# Patient Record
Sex: Female | Born: 1955 | Race: Black or African American | Hispanic: No | Smoking: Current every day smoker
Health system: Southern US, Community
[De-identification: ages and names within clinical notes are randomized; demographics above are authoritative.]

## PROBLEM LIST (undated history)

## (undated) DIAGNOSIS — K769 Liver disease, unspecified: Secondary | ICD-10-CM

## (undated) DIAGNOSIS — K219 Gastro-esophageal reflux disease without esophagitis: Secondary | ICD-10-CM

## (undated) DIAGNOSIS — I10 Essential (primary) hypertension: Secondary | ICD-10-CM

## (undated) DIAGNOSIS — B1920 Unspecified viral hepatitis C without hepatic coma: Secondary | ICD-10-CM

## (undated) HISTORY — DX: Gastro-esophageal reflux disease without esophagitis: K21.9

## (undated) HISTORY — DX: Essential (primary) hypertension: I10

## (undated) HISTORY — DX: Unspecified viral hepatitis C without hepatic coma: B19.20

## (undated) HISTORY — DX: Liver disease, unspecified: K76.9

---

## 2007-03-28 ENCOUNTER — Ambulatory Visit: Payer: Self-pay | Admitting: Cardiology

## 2016-07-05 ENCOUNTER — Encounter (INDEPENDENT_AMBULATORY_CARE_PROVIDER_SITE_OTHER): Payer: Self-pay | Admitting: Internal Medicine

## 2016-07-05 ENCOUNTER — Encounter (INDEPENDENT_AMBULATORY_CARE_PROVIDER_SITE_OTHER): Payer: Self-pay

## 2016-07-25 ENCOUNTER — Ambulatory Visit (INDEPENDENT_AMBULATORY_CARE_PROVIDER_SITE_OTHER): Payer: Self-pay | Admitting: Internal Medicine

## 2016-08-16 ENCOUNTER — Ambulatory Visit (INDEPENDENT_AMBULATORY_CARE_PROVIDER_SITE_OTHER): Payer: Self-pay | Admitting: Internal Medicine

## 2020-08-19 ENCOUNTER — Encounter (INDEPENDENT_AMBULATORY_CARE_PROVIDER_SITE_OTHER): Payer: Self-pay | Admitting: *Deleted

## 2020-11-24 ENCOUNTER — Ambulatory Visit (INDEPENDENT_AMBULATORY_CARE_PROVIDER_SITE_OTHER): Payer: Medicare HMO | Admitting: Gastroenterology

## 2020-11-24 ENCOUNTER — Encounter (INDEPENDENT_AMBULATORY_CARE_PROVIDER_SITE_OTHER): Payer: Self-pay | Admitting: Gastroenterology

## 2020-11-24 ENCOUNTER — Other Ambulatory Visit: Payer: Self-pay

## 2020-11-24 DIAGNOSIS — B182 Chronic viral hepatitis C: Secondary | ICD-10-CM | POA: Diagnosis not present

## 2020-11-24 NOTE — Patient Instructions (Signed)
Perform blood workup Schedule liver elastography  

## 2020-11-24 NOTE — Progress Notes (Signed)
Brandi Costa, M.D. Gastroenterology & Hepatology [facility] [address] [place], [place] [address] Primary Care Physician: Jarvis Morgan, DO [address] [place] [address]  Referring MD: PCP  Chief Complaint: Hepatitis C  History of Present Illness: Brandi Costa is a 65 y.o. female with medical history of GERD, hypertension and hepatitis C, who presents for evaluation of hepatitis C.  Patient reports that in the 1993 she was told she tested for hepatitis C. She reports that she was a blood donor in the past but she was notified she had hepatitis C and could not donate more. She has never received treatment for hepatitis C. She reports that she received a blood transfusion when she underwent a hysterectomy. Denies tattoos. Denies any drugs in the past. She states that she had a STD in the past, does not know which one but was treated - does not know if any of her sexual partners had hepatitis C.  The patient was evaluated by her PCP in January 2022, had testing for hepatitis C check at that time.  Blood test reviewed from 08/12/2020 which showed positive hepatitis C antibody with RNA titer of 984,000.  HIV testing was negative.  CMP showed AST of 29, ALT of 27, alkaline phosphatase 97, total bilirubin 0.4, normal electrolytes, creatinine 1.07, BUN 24, albumin 4.2, CBC with low cell count 9.5, hemoglobin 13.8 and platelets 218.  Patient was referred to gastroenterology clinic for further evaluation.  She denies having any frequent complaints, but reports that after her most recent colonoscopy she has presented episodes of bloating. Has presented episodes of excessive tiredness in the past, as well as sleepiness but not recently. The patient denies having any nausea, vomiting, fever, chills, hematochezia, melena, hematemesis, abdominal distention, abdominal pain, diarrhea, jaundice, pruritus or weight loss.  Takes famotidine as  needed for heartburn.  Last EGD:2005 - possible diverticulum in esophagus? No report available Last Colonoscopy: 04/07/2020, report from [facility] describes multiple 3 to 5 mm polyps in the sigmoid, ascending and cecum, removed with hot snare.  No pathology is available but based on notes, there was presence of multiple tubular adenomas and patient was recommended to have a repeat colonoscopy in 3 years.  FHx: neg for any gastrointestinal/liver disease, no malignancies Social: smokes 1/4 pack a day, neg alcohol or illicit drug use Surgical: hysterectomy  Past Medical History: Past Medical History:  Diagnosis Date  . GERD (gastroesophageal reflux disease)   . Hepatitis C   . Hypertension   . Liver disease     Past Surgical History:History reviewed. No pertinent surgical history.  Family History: Family History  Problem Relation Age of Onset  . Coronary artery disease Father   . Heart disease Sister   . Coronary artery disease Brother   . Hypertension Brother     Social History: Social History   Tobacco Use  Smoking Status Current Every Day Smoker  . Packs/day: 0.25  . Types: Cigarettes  Smokeless Tobacco Never Used   Social History   Substance and Sexual Activity  Alcohol Use Never   Social History   Substance and Sexual Activity  Drug Use Never    Allergies: Allergies  Allergen Reactions  . Asa [Aspirin] Diarrhea  . Codeine Diarrhea    Medications: Current Outpatient Medications  Medication Sig Dispense Refill  . Ascorbic Acid (VITAMIN C WITH ROSE HIPS) 1000 MG tablet Take 1,000 mg by mouth daily.    . famotidine (PEPCID) 20  MG tablet Take 20 mg by mouth 2 (two) times daily.    Marland Kitchen loratadine (CLARITIN) 10 MG tablet Take 10 mg by mouth daily.    Marland Kitchen OVER THE COUNTER MEDICATION Vit D3 1000 IU once per day.    Marland Kitchen OVER THE COUNTER MEDICATION Vit E 450 mg once per day.    . triamterene-hydrochlorothiazide (DYAZIDE) 37.5-25 MG capsule Take 1 capsule by mouth  daily.    . Turmeric (QC TUMERIC COMPLEX PO) Take by mouth. 500 mg once per day.     No current facility-administered medications for this visit.    Review of Systems: GENERAL: negative for malaise, night sweats HEENT: No changes in hearing or vision, no nose bleeds or other nasal problems. NECK: Negative for lumps, goiter, pain and significant neck swelling RESPIRATORY: Negative for cough, wheezing CARDIOVASCULAR: Negative for chest pain, leg swelling, palpitations, orthopnea GI: SEE HPI MUSCULOSKELETAL: Negative for joint pain or swelling, back pain, and muscle pain. SKIN: Negative for lesions, rash PSYCH: Negative for sleep disturbance, mood disorder and recent psychosocial stressors. HEMATOLOGY Negative for prolonged bleeding, bruising easily, and swollen nodes. ENDOCRINE: Negative for cold or heat intolerance, polyuria, polydipsia and goiter. NEURO: negative for tremor, gait imbalance, syncope and seizures. The remainder of the review of systems is noncontributory.   Physical Exam: BP 133/80 (BP Location: Left Arm, Patient Position: Sitting, Cuff Size: Large)   Pulse (!) 101   Temp 98.7 F (37.1 C) (Oral)   Ht 5\' 4"  (1.626 m)   Wt 168 lb (76.2 kg)   BMI 28.84 kg/m  GENERAL: The patient is AO x3, in no acute distress. HEENT: Head is normocephalic and atraumatic. EOMI are intact. Mouth is well hydrated and without lesions. NECK: Supple. No masses LUNGS: Clear to auscultation. No presence of rhonchi/wheezing/rales. Adequate chest expansion HEART: RRR, normal s1 and s2. ABDOMEN: Soft, nontender, no guarding, no peritoneal signs, and nondistended. BS +. No masses. EXTREMITIES: Without any cyanosis, clubbing, rash, lesions or edema. NEUROLOGIC: AOx3, no focal motor deficit. SKIN: no jaundice, no rashes   Imaging/Labs: as above  I personally reviewed and interpreted the available labs, imaging and endoscopic files.  Impression and Plan: [name] is a 65 y.o. female  with medical history of GERD, hypertension and hepatitis C, who presents for evaluation of hepatitis C. the patient has history of chronic hepatitis C which has never been treated in the past.  It is likely she acquired this infection due to her previous blood transfusion.  She has not presented any To see manifestations of hepatitis C.  Her most recent blood testing did not show any alterations suggestive of liver fibrosis.  However, given the chronicity of her symptoms we will check a liver elastography and obtain MELD labs, I will also check a hepatitis C genotype, as well as hepatitis B core antibody and surface antigen before deciding the treatment regimen for her virus.  - check CBC, CMP, INR, hepatitis C genotype, hepatitis B core antibody and surface antigen -Schedule liver elastography  All questions were answered.      77, MD Gastroenterology and Hepatology [facility]

## 2020-11-28 LAB — COMPREHENSIVE METABOLIC PANEL
AG Ratio: 1 (calc) (ref 1.0–2.5)
ALT: 23 U/L (ref 6–29)
AST: 25 U/L (ref 10–35)
Albumin: 4.1 g/dL (ref 3.6–5.1)
Alkaline phosphatase (APISO): 99 U/L (ref 37–153)
BUN/Creatinine Ratio: 29 (calc) — ABNORMAL HIGH (ref 6–22)
BUN: 30 mg/dL — ABNORMAL HIGH (ref 7–25)
CO2: 25 mmol/L (ref 20–32)
Calcium: 9.4 mg/dL (ref 8.6–10.4)
Chloride: 106 mmol/L (ref 98–110)
Creat: 1.03 mg/dL — ABNORMAL HIGH (ref 0.50–0.99)
Globulin: 4.2 g/dL (calc) — ABNORMAL HIGH (ref 1.9–3.7)
Glucose, Bld: 81 mg/dL (ref 65–99)
Potassium: 4.2 mmol/L (ref 3.5–5.3)
Sodium: 139 mmol/L (ref 135–146)
Total Bilirubin: 0.4 mg/dL (ref 0.2–1.2)
Total Protein: 8.3 g/dL — ABNORMAL HIGH (ref 6.1–8.1)

## 2020-11-28 LAB — HEPATITIS B CORE ANTIBODY, TOTAL: Hep B Core Total Ab: NONREACTIVE

## 2020-11-28 LAB — PROTIME-INR
INR: 1
Prothrombin Time: 9.9 s (ref 9.0–11.5)

## 2020-11-28 LAB — CBC WITH DIFFERENTIAL/PLATELET
Absolute Monocytes: 588 cells/uL (ref 200–950)
Basophils Absolute: 74 cells/uL (ref 0–200)
Basophils Relative: 0.7 %
Eosinophils Absolute: 263 cells/uL (ref 15–500)
Eosinophils Relative: 2.5 %
HCT: 41 % (ref 35.0–45.0)
Hemoglobin: 13.7 g/dL (ref 11.7–15.5)
Lymphs Abs: 3350 cells/uL (ref 850–3900)
MCH: 27.8 pg (ref 27.0–33.0)
MCHC: 33.4 g/dL (ref 32.0–36.0)
MCV: 83.2 fL (ref 80.0–100.0)
MPV: 9.4 fL (ref 7.5–12.5)
Monocytes Relative: 5.6 %
Neutro Abs: 6227 cells/uL (ref 1500–7800)
Neutrophils Relative %: 59.3 %
Platelets: 232 10*3/uL (ref 140–400)
RBC: 4.93 10*6/uL (ref 3.80–5.10)
RDW: 12.9 % (ref 11.0–15.0)
Total Lymphocyte: 31.9 %
WBC: 10.5 10*3/uL (ref 3.8–10.8)

## 2020-11-28 LAB — AFP TUMOR MARKER: AFP-Tumor Marker: 6.5 ng/mL — ABNORMAL HIGH

## 2020-11-28 LAB — HEPATITIS B SURFACE ANTIGEN: Hepatitis B Surface Ag: NONREACTIVE

## 2020-11-28 LAB — HEPATITIS C GENOTYPE

## 2020-12-07 ENCOUNTER — Ambulatory Visit (HOSPITAL_COMMUNITY): Payer: Medicare HMO

## 2020-12-12 ENCOUNTER — Ambulatory Visit (HOSPITAL_COMMUNITY): Payer: Medicare HMO

## 2020-12-15 ENCOUNTER — Ambulatory Visit (HOSPITAL_COMMUNITY): Admission: RE | Admit: 2020-12-15 | Payer: Medicare HMO | Source: Ambulatory Visit

## 2020-12-15 ENCOUNTER — Ambulatory Visit (HOSPITAL_COMMUNITY)
Admission: RE | Admit: 2020-12-15 | Discharge: 2020-12-15 | Disposition: A | Discharge status: Home / Self Care | Payer: Medicare HMO | Source: Ambulatory Visit | Attending: Gastroenterology | Admitting: Gastroenterology

## 2020-12-15 DIAGNOSIS — B182 Chronic viral hepatitis C: Secondary | ICD-10-CM | POA: Diagnosis present

## 2020-12-16 ENCOUNTER — Other Ambulatory Visit (INDEPENDENT_AMBULATORY_CARE_PROVIDER_SITE_OTHER): Payer: Self-pay | Admitting: Gastroenterology

## 2020-12-16 DIAGNOSIS — B182 Chronic viral hepatitis C: Secondary | ICD-10-CM

## 2020-12-16 MED ORDER — MAVYRET 100-40 MG PO TABS: 3.0000 | ORAL_TABLET | Freq: Every day | ORAL | 1 refills | Status: AC

## 2020-12-30 NOTE — Progress Notes (Signed)
I called the patient and advised that [facility] was trying to reach her. I gave her the phone number to call. She states she would call them to get the process moving.   [facility] [place]) [phone] called and states they can not reach the patient.

## 2021-01-03 ENCOUNTER — Telehealth (INDEPENDENT_AMBULATORY_CARE_PROVIDER_SITE_OTHER): Payer: Self-pay

## 2021-01-03 NOTE — Telephone Encounter (Signed)
I called and left a vm asked that the patient please return call. 

## 2021-01-03 NOTE — Telephone Encounter (Signed)
She should wait until she finishes her Mavyret. Once finished, we will check her labs and if normal she will be able to start it.  Thanks

## 2021-01-03 NOTE — Telephone Encounter (Signed)
Patient aware of all.

## 2021-01-03 NOTE — Telephone Encounter (Signed)
Patient is calling wanting to know if ok to start Terbinafine per her podiatrist. They want to know if this is something she can take with Mavyret or should she wait till after completing the mavyret treatment. Please advise

## 2021-01-05 ENCOUNTER — Other Ambulatory Visit (INDEPENDENT_AMBULATORY_CARE_PROVIDER_SITE_OTHER): Payer: Self-pay

## 2021-01-05 DIAGNOSIS — B182 Chronic viral hepatitis C: Secondary | ICD-10-CM

## 2021-02-06 LAB — COMPREHENSIVE METABOLIC PANEL
AG Ratio: 1 (calc) (ref 1.0–2.5)
ALT: 9 U/L (ref 6–29)
AST: 17 U/L (ref 10–35)
Albumin: 4.1 g/dL (ref 3.6–5.1)
Alkaline phosphatase (APISO): 116 U/L (ref 37–153)
BUN/Creatinine Ratio: 21 (calc) (ref 6–22)
BUN: 25 mg/dL (ref 7–25)
CO2: 25 mmol/L (ref 20–32)
Calcium: 9.8 mg/dL (ref 8.6–10.4)
Chloride: 105 mmol/L (ref 98–110)
Creat: 1.21 mg/dL — ABNORMAL HIGH (ref 0.50–1.05)
Globulin: 4.2 g/dL (calc) — ABNORMAL HIGH (ref 1.9–3.7)
Glucose, Bld: 80 mg/dL (ref 65–99)
Potassium: 4.6 mmol/L (ref 3.5–5.3)
Sodium: 139 mmol/L (ref 135–146)
Total Bilirubin: 0.4 mg/dL (ref 0.2–1.2)
Total Protein: 8.3 g/dL — ABNORMAL HIGH (ref 6.1–8.1)

## 2021-02-06 LAB — HEPATITIS C RNA QUANTITATIVE
HCV Quantitative Log: 1.31 log IU/mL — ABNORMAL HIGH
HCV RNA, PCR, QN: 20 IU/mL — ABNORMAL HIGH

## 2021-02-20 ENCOUNTER — Other Ambulatory Visit (INDEPENDENT_AMBULATORY_CARE_PROVIDER_SITE_OTHER): Payer: Self-pay

## 2021-02-20 DIAGNOSIS — B182 Chronic viral hepatitis C: Secondary | ICD-10-CM

## 2021-03-05 LAB — HEPATITIS C RNA QUANTITATIVE
HCV Quantitative Log: <1.18 log IU/mL
HCV RNA, PCR, QN: <15 IU/mL

## 2021-03-05 LAB — COMPREHENSIVE METABOLIC PANEL
AG Ratio: 1 (calc) (ref 1.0–2.5)
ALT: 9 U/L (ref 6–29)
AST: 17 U/L (ref 10–35)
Albumin: 4 g/dL (ref 3.6–5.1)
Alkaline phosphatase (APISO): 121 U/L (ref 37–153)
BUN: 19 mg/dL (ref 7–25)
CO2: 27 mmol/L (ref 20–32)
Calcium: 9.2 mg/dL (ref 8.6–10.4)
Chloride: 107 mmol/L (ref 98–110)
Creat: 0.99 mg/dL (ref 0.50–1.05)
Globulin: 3.9 g/dL (calc) — ABNORMAL HIGH (ref 1.9–3.7)
Glucose, Bld: 76 mg/dL (ref 65–139)
Potassium: 4.5 mmol/L (ref 3.5–5.3)
Sodium: 139 mmol/L (ref 135–146)
Total Bilirubin: 0.4 mg/dL (ref 0.2–1.2)
Total Protein: 7.9 g/dL (ref 6.1–8.1)

## 2021-05-12 ENCOUNTER — Encounter (INDEPENDENT_AMBULATORY_CARE_PROVIDER_SITE_OTHER): Payer: Self-pay

## 2021-05-12 ENCOUNTER — Other Ambulatory Visit (INDEPENDENT_AMBULATORY_CARE_PROVIDER_SITE_OTHER): Payer: Self-pay

## 2021-05-12 DIAGNOSIS — B182 Chronic viral hepatitis C: Secondary | ICD-10-CM

## 2021-05-28 LAB — COMPREHENSIVE METABOLIC PANEL
AG Ratio: 1 (calc) (ref 1.0–2.5)
ALT: 9 U/L (ref 6–29)
AST: 13 U/L (ref 10–35)
Albumin: 3.8 g/dL (ref 3.6–5.1)
Alkaline phosphatase (APISO): 103 U/L (ref 37–153)
BUN: 23 mg/dL (ref 7–25)
CO2: 25 mmol/L (ref 20–32)
Calcium: 9.2 mg/dL (ref 8.6–10.4)
Chloride: 108 mmol/L (ref 98–110)
Creat: 1.04 mg/dL (ref 0.50–1.05)
Globulin: 4 g/dL (calc) — ABNORMAL HIGH (ref 1.9–3.7)
Glucose, Bld: 84 mg/dL (ref 65–139)
Potassium: 4.3 mmol/L (ref 3.5–5.3)
Sodium: 138 mmol/L (ref 135–146)
Total Bilirubin: 0.3 mg/dL (ref 0.2–1.2)
Total Protein: 7.8 g/dL (ref 6.1–8.1)

## 2021-05-28 LAB — HEPATITIS C RNA QUANTITATIVE
HCV Quantitative Log: <1.18 log IU/mL
HCV RNA, PCR, QN: <15 IU/mL

## 2021-07-10 ENCOUNTER — Encounter (INDEPENDENT_AMBULATORY_CARE_PROVIDER_SITE_OTHER): Payer: Self-pay | Admitting: Gastroenterology

## 2021-07-10 ENCOUNTER — Ambulatory Visit (INDEPENDENT_AMBULATORY_CARE_PROVIDER_SITE_OTHER): Payer: Medicare HMO | Admitting: Gastroenterology

## 2021-07-10 ENCOUNTER — Other Ambulatory Visit: Payer: Self-pay

## 2021-07-10 DIAGNOSIS — Z8619 Personal history of other infectious and parasitic diseases: Secondary | ICD-10-CM | POA: Diagnosis not present

## 2021-07-10 DIAGNOSIS — R1030 Lower abdominal pain, unspecified: Secondary | ICD-10-CM | POA: Diagnosis not present

## 2021-07-10 DIAGNOSIS — R748 Abnormal levels of other serum enzymes: Secondary | ICD-10-CM | POA: Diagnosis not present

## 2021-07-10 DIAGNOSIS — K5900 Constipation, unspecified: Secondary | ICD-10-CM | POA: Diagnosis not present

## 2021-07-10 NOTE — Progress Notes (Signed)
Katrinka Blazing, M.D. Gastroenterology & Hepatology [facility] [address] [place], [place] [address]  Primary Care Physician: Jarvis Morgan, DO [address] [place] [address]  I will communicate my assessment and recommendations to the referring MD via EMR.  Problems: Hepatitis C genotype status post Mavyret -achieved SVR Epigastric pain  History of Present Illness: Brandi Costa is a 65 y.o. female with medical history of GERD, hypertension and hepatitis C, who presents for follow up of hepatitis C an epigastric pain.  The patient was last seen on 11/24/2020. At that time, the patient underwent work-up for chronic hepatitis C. Her elastography was normal.  She had an 8-week course with Mavyret which she finished completely and achieved SVR on 05/26/2021.  Patient is a poor historian. Patient reports that she has presented some pain in her epigastric area for the last 3 months. States she had a broken rib in Labor day - this happened after she hit her chest against her fridge. She states she has presented pain that has been intermittent but exacerbated when her "grandson has been bouncing on top of her". No clear radiation of pain anywhere else. There are no other triggers for the pain.She is not taking any medications for the pain.  She also reports having some bloating. She occasionally feels nauseated but does not vomit.   She has a BM every day, but is small in amount and is very hard.  The patient denies having any fever, chills, hematochezia, melena, hematemesis, diarrhea, jaundice, pruritus. Has lost a few pounds recently.  Notably, the patient was seen by her PCP on 06/02/2021 who performed some blood testing that showed mildly increased amylase of 135 and a mildly increased lipase of 98.  She had recent CMP on 05/26/2021 that showed completely normal aminotransferases with AST of 13 and ALT of 9, alkaline phosphatase  of 103, total bilirubin was normal 0.3.  Last EGD:2005 - possible diverticulum in esophagus? No report available Last Colonoscopy: 04/07/2020, report from [facility] describes multiple 3 to 5 mm polyps in the sigmoid, ascending and cecum, removed with hot snare.  No pathology is available but based on notes, there was presence of multiple tubular adenomas and patient was recommended to have a repeat colonoscopy in 3 years.  Past Medical History: Past Medical History:  Diagnosis Date   GERD (gastroesophageal reflux disease)    Hepatitis C    Hypertension    Liver disease     Past Surgical History:No past surgical history on file.  Family History: Family History  Problem Relation Age of Onset   Coronary artery disease Father    Heart disease Sister    Coronary artery disease Brother    Hypertension Brother     Social History: Social History   Tobacco Use  Smoking Status Every Day   Packs/day: 0.25   Types: Cigarettes  Smokeless Tobacco Never   Social History   Substance and Sexual Activity  Alcohol Use Never   Social History   Substance and Sexual Activity  Drug Use Never    Allergies: Allergies  Allergen Reactions   Asa [Aspirin] Diarrhea   Codeine Diarrhea    Medications: Current Outpatient Medications  Medication Sig Dispense Refill   Ascorbic Acid (VITAMIN C WITH ROSE HIPS) 1000 MG tablet Take 1,000 mg by mouth daily.     famotidine (PEPCID) 20 MG tablet Take 20 mg by mouth 2 (two) times daily.     loratadine (  CLARITIN) 10 MG tablet Take 10 mg by mouth daily.     OVER THE COUNTER MEDICATION Vit D3 1000 IU once per day.     OVER THE COUNTER MEDICATION Vit E 450 mg once per day.     triamterene-hydrochlorothiazide (DYAZIDE) 37.5-25 MG capsule Take 1 capsule by mouth daily.     Turmeric (QC TUMERIC COMPLEX PO) Take by mouth. 500 mg once per day.     No current facility-administered medications for this visit.    Review of Systems: GENERAL: negative  for malaise, night sweats HEENT: No changes in hearing or vision, no nose bleeds or other nasal problems. NECK: Negative for lumps, goiter, pain and significant neck swelling RESPIRATORY: Negative for cough, wheezing CARDIOVASCULAR: Negative for chest pain, leg swelling, palpitations, orthopnea GI: SEE HPI MUSCULOSKELETAL: Negative for joint pain or swelling, back pain, and muscle pain. SKIN: Negative for lesions, rash PSYCH: Negative for sleep disturbance, mood disorder and recent psychosocial stressors. HEMATOLOGY Negative for prolonged bleeding, bruising easily, and swollen nodes. ENDOCRINE: Negative for cold or heat intolerance, polyuria, polydipsia and goiter. NEURO: negative for tremor, gait imbalance, syncope and seizures. The remainder of the review of systems is noncontributory.   Physical Exam: BP 118/79 (BP Location: Left Arm, Patient Position: Sitting, Cuff Size: Large)    Pulse 89    Temp 98 F (36.7 C) (Oral)    Ht 5\' 4"  (1.626 m)    Wt 168 lb 9.6 oz (76.5 kg)    BMI 28.94 kg/m  GENERAL: The patient is AO x3, in no acute distress. HEENT: Head is normocephalic and atraumatic. EOMI are intact. Mouth is well hydrated and without lesions. NECK: Supple. No masses LUNGS: Clear to auscultation. No presence of rhonchi/wheezing/rales. Adequate chest expansion HEART: RRR, normal s1 and s2. ABDOMEN: mildly tender in lower abdomen, no guarding, no peritoneal signs, and nondistended. BS +. No masses. EXTREMITIES: Without any cyanosis, clubbing, rash, lesions or edema. NEUROLOGIC: AOx3, no focal motor deficit. SKIN: no jaundice, no rashes  Imaging/Labs: as above  I personally reviewed and interpreted the available labs, imaging and endoscopic files.  Impression and Plan: Brandi Costa is a 65 y.o. female with medical history of GERD, hypertension and hepatitis C, who presents for follow up of hepatitis C an epigastric pain.  The patient has presented recurrent episodes of pain in  her epigastric area of unclear etiology.  She has not presented any red flag signs.  This could be a reflection of her worsening constipation, from which she will benefit from implementing the intake of MiraLAX to increase her bowel movement frequency, as well as from implementing lifestyle dietary modifications.  She would like to proceed with this before trying more invasive diagnostic approaches which I consider reasonable.  If she persist with pain, we will proceed with a CT of the abdomen and pelvis with IV contrast.  She has achieved SVR and does not warrant any further management of her hepatitis C she is cured.  The mild elevation in her lipase and amylase are nonspecific and do not warrant any further management at this point  - Start taking Miralax 1 capful every day for one week. If bowel movements do not improve, increase to 1 capful every 12 hours. If after two weeks there is no improvement, increase to 1 capful every 8 hours - Eat prune and/or kiwi daily - If persistent pain despite having improvement in bowel movements, will consider a CT abdomen and pelvis with IV contrast  All questions  were answered.      Harvel Quale, MD Gastroenterology and Hepatology [facility]

## 2021-07-10 NOTE — Patient Instructions (Signed)
Start taking Miralax 1 capful every day for one week. If bowel movements do not improve, increase to 1 capful every 12 hours. If after two weeks there is no improvement, increase to 1 capful every 8 hours Eat prune and/or kiwi daily If persistent pain despite having improvement in bowel movements, will consider a CT abdomen and pelvis with IV contrast

## 2022-04-12 IMAGING — US US ABDOMEN LIMITED W/ ELASTOGRAPHY
1 series · 12 of 25 positions shown · non-contrast
Comparison: None.

CLINICAL DATA: Hepatitis C

EXAM:
US ABDOMEN LIMITED - RIGHT UPPER QUADRANT
ULTRASOUND HEPATIC ELASTOGRAPHY
TECHNIQUE: Sonography of the right upper quadrant was performed. In addition,
ultrasound elastography evaluation of the liver was performed. A
region of interest was placed within the right lobe of the liver.
Following application of a compressive sonographic pulse, tissue
compressibility was assessed. Multiple assessments were performed at
the selected site. Median tissue compressibility was determined.
Previously, hepatic stiffness was assessed by shear wave velocity.
Based on recently published Society of Radiologists in Ultrasound
consensus article, reporting is now recommended to be performed in
the SI units of pressure (kiloPascals) representing hepatic
stiffness/elasticity. The obtained result is compared to the
published reference standards. (cACLD = compensated Advanced Chronic
Liver Disease)

[Series 1: us abdomen ruq w/elastography · 12 of 70 slices shown]
[im 3/70]
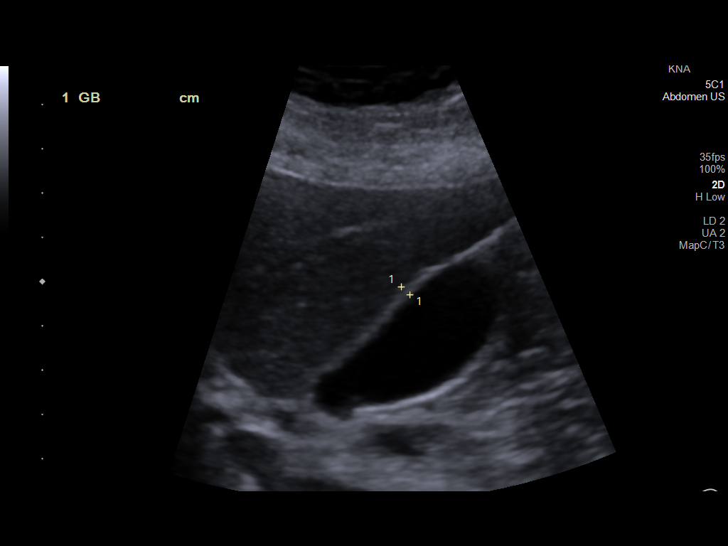
[im 9/70]
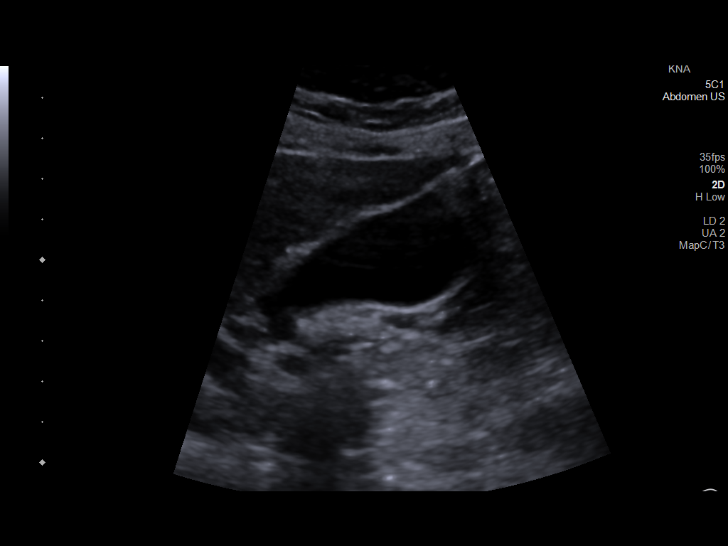
[im 15/70]
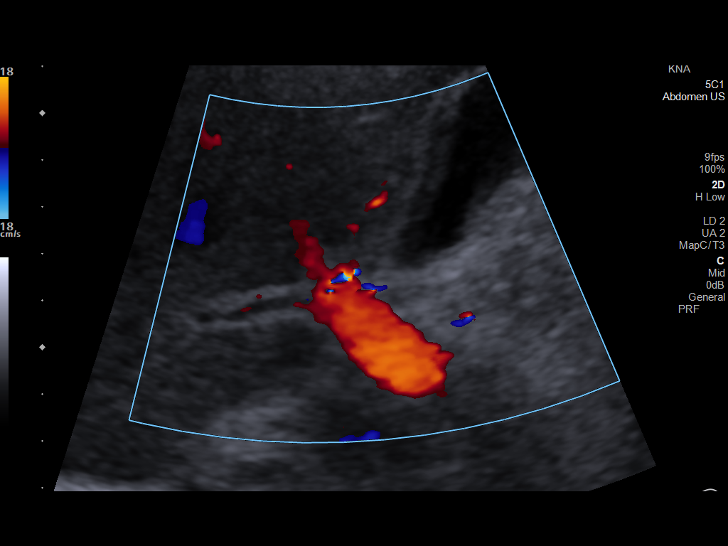
[im 21/70]
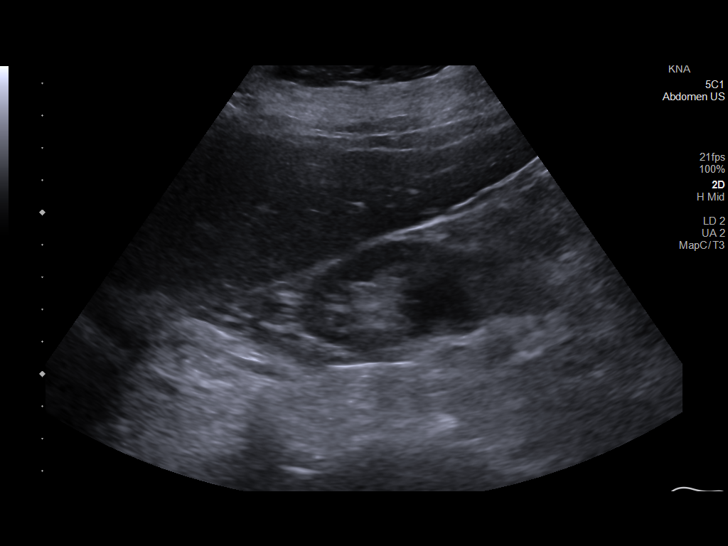
[im 26/70]
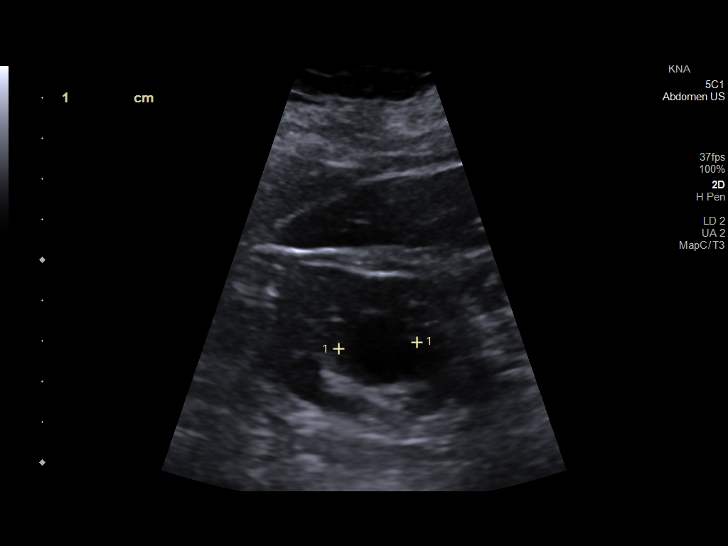
[im 32/70]
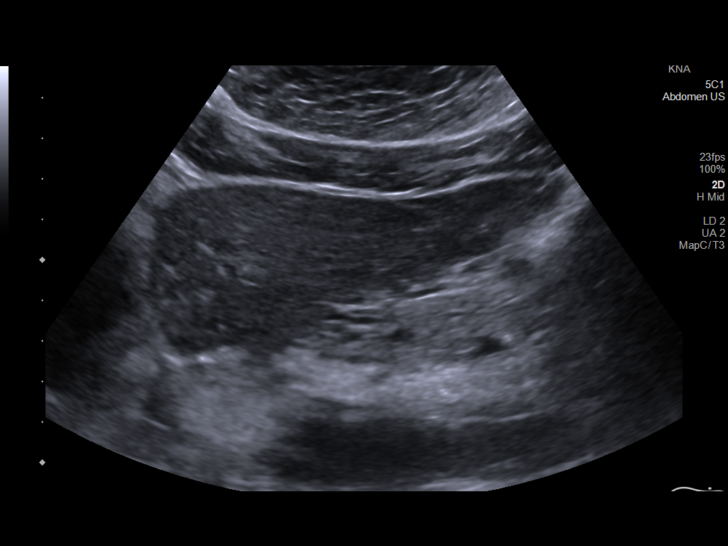
[im 38/70]
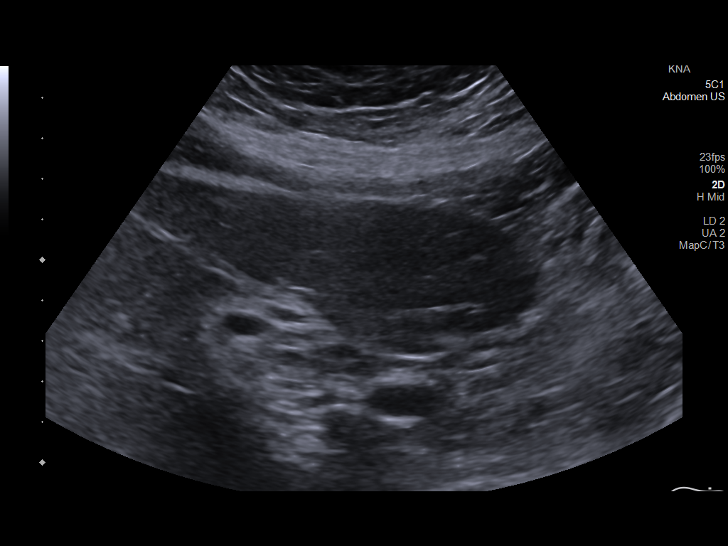
[im 44/70]
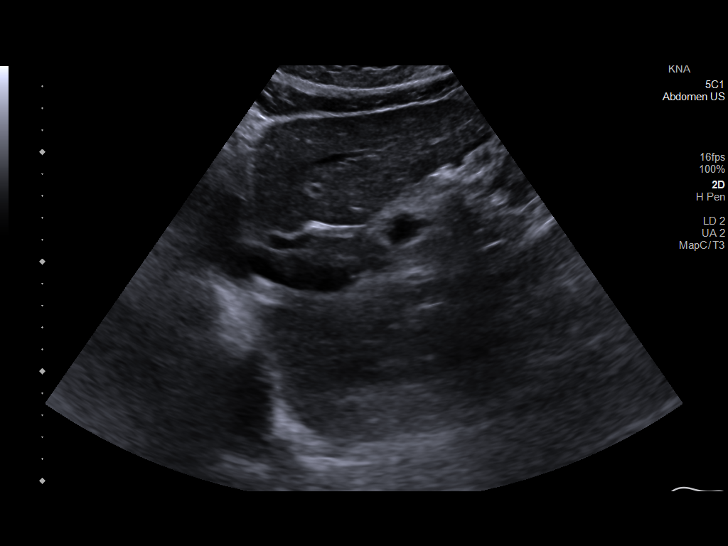
[im 49/70]
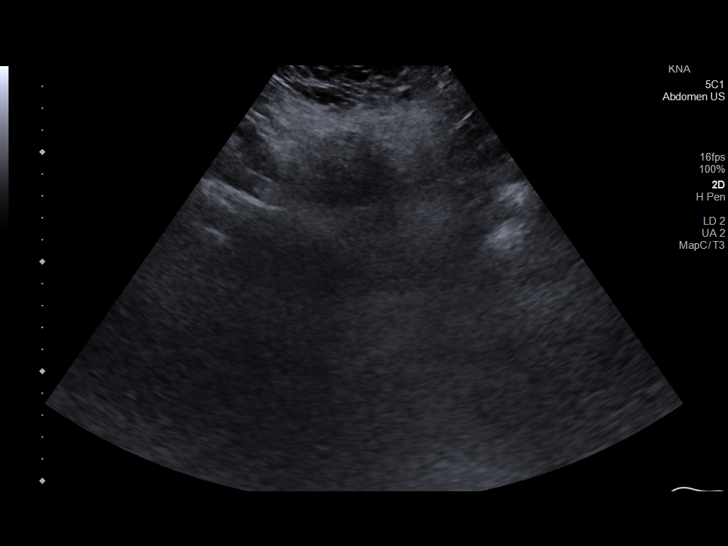
[im 55/70]
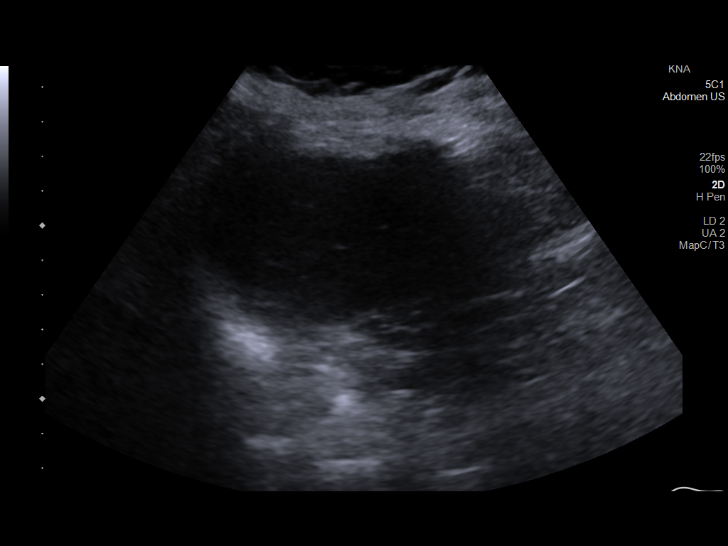
[im 61/70]
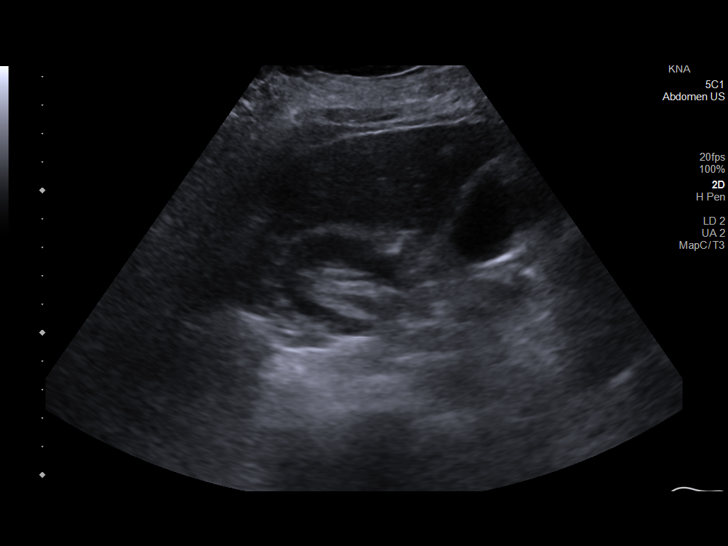
[im 67/70]
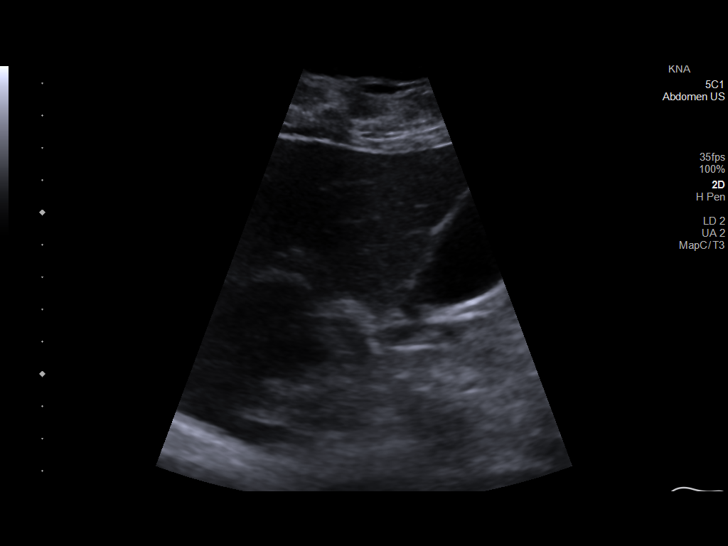

[12 of 25 positions shown; findings below may reference images not displayed]

FINDINGS: ULTRASOUND ABDOMEN LIMITED RIGHT UPPER QUADRANT

Gallbladder:

No gallstones or wall thickening visualized. No sonographic Murphy
sign noted.

Common bile duct:

Diameter: 5 mm

Liver:

No focal lesion identified. Coarse, nodular contour of the liver.
Within normal limits in parenchymal echogenicity. Portal vein is
patent on color Doppler imaging with normal direction of blood flow
towards the liver.

ULTRASOUND HEPATIC ELASTOGRAPHY

Device: Siemens Helix VTQ

Patient position: Supine

Transducer 4V1

Number of measurements: 10

Hepatic segment:  8

Median kPa:

IQR:

IQR/Median kPa ratio:

Data quality:  Good

Diagnostic category:  < or = 5 kPa: high probability of being normal

The use of hepatic elastography is applicable to patients with viral
hepatitis and non-alcoholic fatty liver disease. At this time, there
is insufficient data for the referenced cut-off values and use in
other causes of liver disease, including alcoholic liver disease.
Patients, however, may be assessed by elastography and serve as
their own reference standard/baseline.

In patients with non-alcoholic liver disease, the values suggesting
compensated advanced chronic liver disease (cACLD) may be lower, and
patients may need additional testing with elasticity results of [DATE]
kPa.

Please note that abnormal hepatic elasticity and shear wave
velocities may also be identified in clinical settings other than
with hepatic fibrosis, such as: acute hepatitis, elevated right
heart and central venous pressures including use of beta blockers,
Kalid disease (Lienad), infiltrative processes such as
mastocytosis/amyloidosis/infiltrative tumor/lymphoma, extrahepatic
cholestasis, with hyperemia in the post-prandial state, and with
liver transplantation. Correlation with patient history, laboratory
data, and clinical condition recommended.

Diagnostic Categories:

< or =5 kPa: high probability of being normal

< or =9 kPa: in the absence of other known clinical signs, rules [DATE] kPa and ?13 kPa: suggestive of cACLD, but needs further testing

>13 kPa: highly suggestive of cACLD

> or =17 kPa: highly suggestive of cACLD with an increased
probability of clinically significant portal hypertension
IMPRESSION: ULTRASOUND RUQ:

Coarse, nodular contour of the liver, suggesting cirrhosis. No focal
liver lesion. Please note multiphasic contrast enhanced MRI is the
most sensitive test for the screening detection of hepatocellular
carcinoma in the high risk setting of chronic liver disease.

ULTRASOUND HEPATIC ELASTOGRAPHY:

Median kPa:

Diagnostic category:  < or = 5 kPa: high probability of being normal

## 2023-03-19 ENCOUNTER — Encounter (INDEPENDENT_AMBULATORY_CARE_PROVIDER_SITE_OTHER): Payer: Self-pay | Admitting: *Deleted

## 2023-09-19 ENCOUNTER — Encounter (INDEPENDENT_AMBULATORY_CARE_PROVIDER_SITE_OTHER): Payer: Self-pay | Admitting: *Deleted
